# Patient Record
Sex: Male | Born: 1970 | Race: White | Hispanic: No
Health system: Southern US, Community
[De-identification: ages and names within clinical notes are randomized; demographics above are authoritative.]

---

## 2000-01-23 ENCOUNTER — Encounter: Payer: Self-pay | Admitting: Internal Medicine

## 2000-01-23 ENCOUNTER — Emergency Department (HOSPITAL_COMMUNITY): Admission: EM | Admit: 2000-01-23 | Discharge: 2000-01-23 | Payer: Self-pay | Admitting: Internal Medicine

## 2004-08-16 ENCOUNTER — Emergency Department (HOSPITAL_COMMUNITY): Admission: EM | Admit: 2004-08-16 | Discharge: 2004-08-16 | Payer: Self-pay | Admitting: Emergency Medicine

## 2005-09-23 ENCOUNTER — Ambulatory Visit (HOSPITAL_BASED_OUTPATIENT_CLINIC_OR_DEPARTMENT_OTHER): Admission: RE | Admit: 2005-09-23 | Discharge: 2005-09-23 | Payer: Self-pay | Admitting: Urology

## 2005-09-23 ENCOUNTER — Encounter (INDEPENDENT_AMBULATORY_CARE_PROVIDER_SITE_OTHER): Payer: Self-pay | Admitting: Specialist

## 2005-09-27 ENCOUNTER — Ambulatory Visit: Payer: Self-pay | Admitting: Oncology

## 2005-10-06 LAB — CBC WITH DIFFERENTIAL/PLATELET
BASO%: 0.6 % (ref 0.0–2.0)
EOS%: 0.6 % (ref 0.0–7.0)
HCT: 45.8 % (ref 38.7–49.9)
HGB: 16.1 g/dL (ref 13.0–17.1)
MCH: 32.4 pg (ref 28.0–33.4)
MCHC: 35.2 g/dL (ref 32.0–35.9)
MONO#: 0.5 10*3/uL (ref 0.1–0.9)
RDW: 13.5 % (ref 11.2–14.6)
WBC: 10.3 10*3/uL — ABNORMAL HIGH (ref 4.0–10.0)
lymph#: 2.1 10*3/uL (ref 0.9–3.3)

## 2005-10-09 LAB — COMPREHENSIVE METABOLIC PANEL
ALT: 24 U/L (ref 0–40)
AST: 20 U/L (ref 0–37)
Albumin: 4.7 g/dL (ref 3.5–5.2)
CO2: 25 mEq/L (ref 19–32)
Calcium: 9.3 mg/dL (ref 8.4–10.5)
Chloride: 105 mEq/L (ref 96–112)
Potassium: 4.1 mEq/L (ref 3.5–5.3)
Sodium: 141 mEq/L (ref 135–145)
Total Protein: 7.3 g/dL (ref 6.0–8.3)

## 2005-10-09 LAB — LACTATE DEHYDROGENASE: LDH: 153 U/L (ref 94–250)

## 2005-10-15 LAB — CBC WITH DIFFERENTIAL/PLATELET
Basophils Absolute: 0 10*3/uL (ref 0.0–0.1)
Eosinophils Absolute: 0.1 10*3/uL (ref 0.0–0.5)
HGB: 15.7 g/dL (ref 13.0–17.1)
MCV: 93.8 fL (ref 81.6–98.0)
MONO#: 0.6 10*3/uL (ref 0.1–0.9)
MONO%: 5.7 % (ref 0.0–13.0)
NEUT#: 8.7 10*3/uL — ABNORMAL HIGH (ref 1.5–6.5)
RBC: 4.91 10*6/uL (ref 4.20–5.71)
RDW: 13.5 % (ref 11.2–14.6)
WBC: 11.1 10*3/uL — ABNORMAL HIGH (ref 4.0–10.0)
lymph#: 1.6 10*3/uL (ref 0.9–3.3)

## 2005-10-15 LAB — COMPREHENSIVE METABOLIC PANEL
Albumin: 4.6 g/dL (ref 3.5–5.2)
Alkaline Phosphatase: 84 U/L (ref 39–117)
BUN: 20 mg/dL (ref 6–23)
Calcium: 9.7 mg/dL (ref 8.4–10.5)
Chloride: 105 mEq/L (ref 96–112)
Glucose, Bld: 83 mg/dL (ref 70–99)
Potassium: 5.1 mEq/L (ref 3.5–5.3)
Sodium: 143 mEq/L (ref 135–145)
Total Protein: 7.4 g/dL (ref 6.0–8.3)

## 2005-11-04 LAB — CBC WITH DIFFERENTIAL/PLATELET
EOS%: 0.6 % (ref 0.0–7.0)
Eosinophils Absolute: 0 10*3/uL (ref 0.0–0.5)
MCH: 32.4 pg (ref 28.0–33.4)
MCV: 92.3 fL (ref 81.6–98.0)
MONO%: 9.7 % (ref 0.0–13.0)
NEUT#: 1.1 10*3/uL — ABNORMAL LOW (ref 1.5–6.5)
RBC: 4.53 10*6/uL (ref 4.20–5.71)
RDW: 13.6 % (ref 11.2–14.6)
lymph#: 1.3 10*3/uL (ref 0.9–3.3)

## 2005-11-04 LAB — COMPREHENSIVE METABOLIC PANEL
ALT: 26 U/L (ref 0–40)
AST: 19 U/L (ref 0–37)
Albumin: 4.3 g/dL (ref 3.5–5.2)
BUN: 16 mg/dL (ref 6–23)
CO2: 26 mEq/L (ref 19–32)
Calcium: 9.4 mg/dL (ref 8.4–10.5)
Chloride: 106 mEq/L (ref 96–112)
Creatinine, Ser: 1.11 mg/dL (ref 0.40–1.50)
Potassium: 4.6 mEq/L (ref 3.5–5.3)

## 2005-11-09 ENCOUNTER — Ambulatory Visit: Payer: Self-pay | Admitting: Oncology

## 2005-11-12 LAB — COMPREHENSIVE METABOLIC PANEL
ALT: 18 U/L (ref 0–40)
AST: 10 U/L (ref 0–37)
CO2: 25 mEq/L (ref 19–32)
Chloride: 101 mEq/L (ref 96–112)
Creatinine, Ser: 1.03 mg/dL (ref 0.40–1.50)
Sodium: 138 mEq/L (ref 135–145)
Total Protein: 6.5 g/dL (ref 6.0–8.3)

## 2005-11-12 LAB — CBC WITH DIFFERENTIAL/PLATELET
Basophils Absolute: 0 10*3/uL (ref 0.0–0.1)
Eosinophils Absolute: 0 10*3/uL (ref 0.0–0.5)
HGB: 14.8 g/dL (ref 13.0–17.1)
MCV: 91.4 fL (ref 81.6–98.0)
MONO#: 0.1 10*3/uL (ref 0.1–0.9)
MONO%: 1.1 % (ref 0.0–13.0)
NEUT#: 10.8 10*3/uL — ABNORMAL HIGH (ref 1.5–6.5)
RDW: 12.3 % (ref 11.2–14.6)

## 2005-11-26 LAB — COMPREHENSIVE METABOLIC PANEL
Albumin: 4.3 g/dL (ref 3.5–5.2)
Alkaline Phosphatase: 102 U/L (ref 39–117)
BUN: 22 mg/dL (ref 6–23)
CO2: 27 mEq/L (ref 19–32)
Calcium: 9.1 mg/dL (ref 8.4–10.5)
Chloride: 105 mEq/L (ref 96–112)
Glucose, Bld: 121 mg/dL — ABNORMAL HIGH (ref 70–99)
Potassium: 4 mEq/L (ref 3.5–5.3)

## 2005-11-26 LAB — CBC WITH DIFFERENTIAL/PLATELET
Basophils Absolute: 0 10*3/uL (ref 0.0–0.1)
Eosinophils Absolute: 0 10*3/uL (ref 0.0–0.5)
HGB: 13.8 g/dL (ref 13.0–17.1)
MONO#: 0.4 10*3/uL (ref 0.1–0.9)
NEUT#: 4.8 10*3/uL (ref 1.5–6.5)
RBC: 4.24 10*6/uL (ref 4.20–5.71)
RDW: 14.3 % (ref 11.2–14.6)
WBC: 7.2 10*3/uL (ref 4.0–10.0)
lymph#: 2 10*3/uL (ref 0.9–3.3)

## 2005-12-16 LAB — CBC WITH DIFFERENTIAL/PLATELET
Basophils Absolute: 0 10*3/uL (ref 0.0–0.1)
Eosinophils Absolute: 0 10*3/uL (ref 0.0–0.5)
HGB: 13 g/dL (ref 13.0–17.1)
MCV: 94.7 fL (ref 81.6–98.0)
MONO#: 0.4 10*3/uL (ref 0.1–0.9)
MONO%: 10.1 % (ref 0.0–13.0)
NEUT#: 1.7 10*3/uL (ref 1.5–6.5)
Platelets: 169 10*3/uL (ref 145–400)
RBC: 3.95 10*6/uL — ABNORMAL LOW (ref 4.20–5.71)
RDW: 17.1 % — ABNORMAL HIGH (ref 11.2–14.6)
WBC: 3.8 10*3/uL — ABNORMAL LOW (ref 4.0–10.0)

## 2005-12-16 LAB — COMPREHENSIVE METABOLIC PANEL
Albumin: 4.4 g/dL (ref 3.5–5.2)
BUN: 21 mg/dL (ref 6–23)
CO2: 27 mEq/L (ref 19–32)
Calcium: 9.4 mg/dL (ref 8.4–10.5)
Glucose, Bld: 94 mg/dL (ref 70–99)
Potassium: 4.8 mEq/L (ref 3.5–5.3)
Sodium: 143 mEq/L (ref 135–145)
Total Protein: 6.9 g/dL (ref 6.0–8.3)

## 2005-12-21 ENCOUNTER — Ambulatory Visit: Payer: Self-pay | Admitting: Oncology

## 2006-01-11 LAB — CBC WITH DIFFERENTIAL/PLATELET
Basophils Absolute: 0 10*3/uL (ref 0.0–0.1)
EOS%: 0.6 % (ref 0.0–7.0)
Eosinophils Absolute: 0 10*3/uL (ref 0.0–0.5)
HCT: 36.3 % — ABNORMAL LOW (ref 38.7–49.9)
HGB: 12.6 g/dL — ABNORMAL LOW (ref 13.0–17.1)
MCH: 33.8 pg — ABNORMAL HIGH (ref 28.0–33.4)
MONO#: 0.5 10*3/uL (ref 0.1–0.9)
NEUT#: 4.3 10*3/uL (ref 1.5–6.5)
NEUT%: 66.4 % (ref 40.0–75.0)
RDW: 18.5 % — ABNORMAL HIGH (ref 11.2–14.6)
lymph#: 1.6 10*3/uL (ref 0.9–3.3)

## 2006-01-11 LAB — COMPREHENSIVE METABOLIC PANEL
AST: 18 U/L (ref 0–37)
Albumin: 4 g/dL (ref 3.5–5.2)
BUN: 16 mg/dL (ref 6–23)
CO2: 27 mEq/L (ref 19–32)
Calcium: 9.3 mg/dL (ref 8.4–10.5)
Chloride: 104 mEq/L (ref 96–112)
Creatinine, Ser: 1.03 mg/dL (ref 0.40–1.50)
Glucose, Bld: 113 mg/dL — ABNORMAL HIGH (ref 70–99)
Potassium: 4.5 mEq/L (ref 3.5–5.3)

## 2006-03-25 ENCOUNTER — Ambulatory Visit: Payer: Self-pay | Admitting: Oncology

## 2006-03-30 LAB — CBC WITH DIFFERENTIAL/PLATELET
EOS%: 0.6 % (ref 0.0–7.0)
Eosinophils Absolute: 0 10*3/uL (ref 0.0–0.5)
MCH: 33.2 pg (ref 28.0–33.4)
MCV: 96.2 fL (ref 81.6–98.0)
MONO%: 5.1 % (ref 0.0–13.0)
NEUT#: 5 10*3/uL (ref 1.5–6.5)
RBC: 4.73 10*6/uL (ref 4.20–5.71)
RDW: 12.3 % (ref 11.2–14.6)
lymph#: 1.9 10*3/uL (ref 0.9–3.3)

## 2006-04-01 LAB — COMPREHENSIVE METABOLIC PANEL
AST: 34 U/L (ref 0–37)
Albumin: 4.4 g/dL (ref 3.5–5.2)
Alkaline Phosphatase: 76 U/L (ref 39–117)
Chloride: 104 mEq/L (ref 96–112)
Potassium: 4.1 mEq/L (ref 3.5–5.3)
Sodium: 138 mEq/L (ref 135–145)
Total Protein: 6.7 g/dL (ref 6.0–8.3)

## 2006-06-30 ENCOUNTER — Ambulatory Visit (HOSPITAL_COMMUNITY): Admission: RE | Admit: 2006-06-30 | Discharge: 2006-06-30 | Payer: Self-pay | Admitting: Oncology

## 2006-07-05 ENCOUNTER — Ambulatory Visit: Payer: Self-pay | Admitting: Oncology

## 2006-07-07 LAB — CBC WITH DIFFERENTIAL/PLATELET
BASO%: 1.2 % (ref 0.0–2.0)
EOS%: 2.2 % (ref 0.0–7.0)
MCH: 32.9 pg (ref 28.0–33.4)
MCV: 91.9 fL (ref 81.6–98.0)
MONO%: 7.4 % (ref 0.0–13.0)
RBC: 4.59 10*6/uL (ref 4.20–5.71)
RDW: 13.4 % (ref 11.2–14.6)
lymph#: 1.9 10*3/uL (ref 0.9–3.3)

## 2006-07-10 LAB — COMPREHENSIVE METABOLIC PANEL
ALT: 21 U/L (ref 0–53)
AST: 18 U/L (ref 0–37)
Albumin: 4.5 g/dL (ref 3.5–5.2)
Alkaline Phosphatase: 82 U/L (ref 39–117)
Potassium: 4.2 mEq/L (ref 3.5–5.3)
Sodium: 139 mEq/L (ref 135–145)
Total Protein: 7 g/dL (ref 6.0–8.3)

## 2006-07-10 LAB — BETA HCG QUANT (REF LAB): Beta hCG, Tumor Marker: <0.5 m[IU]/mL (ref <5.0)

## 2006-09-30 ENCOUNTER — Ambulatory Visit (HOSPITAL_COMMUNITY): Admission: RE | Admit: 2006-09-30 | Discharge: 2006-09-30 | Payer: Self-pay | Admitting: Oncology

## 2006-10-12 ENCOUNTER — Ambulatory Visit: Payer: Self-pay | Admitting: Oncology

## 2006-10-18 LAB — CBC WITH DIFFERENTIAL/PLATELET
Basophils Absolute: 0 10*3/uL (ref 0.0–0.1)
Eosinophils Absolute: 0.1 10*3/uL (ref 0.0–0.5)
HGB: 16.1 g/dL (ref 13.0–17.1)
MONO#: 0.5 10*3/uL (ref 0.1–0.9)
MONO%: 5.6 % (ref 0.0–13.0)
NEUT#: 6.5 10*3/uL (ref 1.5–6.5)
RBC: 4.89 10*6/uL (ref 4.20–5.71)
RDW: 13 % (ref 11.2–14.6)
WBC: 8.9 10*3/uL (ref 4.0–10.0)
lymph#: 1.8 10*3/uL (ref 0.9–3.3)

## 2006-10-20 LAB — COMPREHENSIVE METABOLIC PANEL
Albumin: 4.6 g/dL (ref 3.5–5.2)
Alkaline Phosphatase: 77 U/L (ref 39–117)
BUN: 19 mg/dL (ref 6–23)
CO2: 24 mEq/L (ref 19–32)
Calcium: 9.6 mg/dL (ref 8.4–10.5)
Chloride: 105 mEq/L (ref 96–112)
Glucose, Bld: 88 mg/dL (ref 70–99)
Potassium: 4.4 mEq/L (ref 3.5–5.3)
Sodium: 139 mEq/L (ref 135–145)
Total Protein: 7.1 g/dL (ref 6.0–8.3)

## 2006-10-20 LAB — AFP TUMOR MARKER: AFP-Tumor Marker: 3.1 ng/mL (ref 0.0–8.0)

## 2007-01-23 ENCOUNTER — Ambulatory Visit: Payer: Self-pay | Admitting: Oncology

## 2007-01-25 LAB — COMPREHENSIVE METABOLIC PANEL
AST: 19 U/L (ref 0–37)
Albumin: 4.5 g/dL (ref 3.5–5.2)
BUN: 19 mg/dL (ref 6–23)
CO2: 23 mEq/L (ref 19–32)
Calcium: 9.4 mg/dL (ref 8.4–10.5)
Chloride: 104 mEq/L (ref 96–112)
Potassium: 4.3 mEq/L (ref 3.5–5.3)

## 2007-01-25 LAB — CBC WITH DIFFERENTIAL/PLATELET
BASO%: 0.5 % (ref 0.0–2.0)
Basophils Absolute: 0 10*3/uL (ref 0.0–0.1)
EOS%: 0.9 % (ref 0.0–7.0)
HGB: 15.9 g/dL (ref 13.0–17.1)
MCH: 32.6 pg (ref 28.0–33.4)
MCHC: 35.7 g/dL (ref 32.0–35.9)
MCV: 91.5 fL (ref 81.6–98.0)
MONO%: 4.4 % (ref 0.0–13.0)
NEUT%: 69.3 % (ref 40.0–75.0)
RDW: 13.2 % (ref 11.2–14.6)

## 2007-01-25 LAB — LACTATE DEHYDROGENASE: LDH: 152 U/L (ref 94–250)

## 2007-04-25 ENCOUNTER — Ambulatory Visit: Payer: Self-pay | Admitting: Oncology

## 2007-04-27 ENCOUNTER — Ambulatory Visit (HOSPITAL_COMMUNITY): Admission: RE | Admit: 2007-04-27 | Discharge: 2007-04-27 | Payer: Self-pay | Admitting: Oncology

## 2007-04-27 LAB — CBC WITH DIFFERENTIAL/PLATELET
BASO%: 0.1 % (ref 0.0–2.0)
LYMPH%: 26.2 % (ref 14.0–48.0)
MCHC: 34.9 g/dL (ref 32.0–35.9)
MCV: 92.3 fL (ref 81.6–98.0)
MONO%: 6.3 % (ref 0.0–13.0)
Platelets: 235 10*3/uL (ref 145–400)
RBC: 5.06 10*6/uL (ref 4.20–5.71)
WBC: 7.9 10*3/uL (ref 4.0–10.0)

## 2007-04-28 LAB — COMPREHENSIVE METABOLIC PANEL
ALT: 90 U/L — ABNORMAL HIGH (ref 0–53)
Albumin: 4.2 g/dL (ref 3.5–5.2)
CO2: 27 mEq/L (ref 19–32)
Calcium: 9.3 mg/dL (ref 8.4–10.5)
Chloride: 100 mEq/L (ref 96–112)
Glucose, Bld: 82 mg/dL (ref 70–99)
Sodium: 136 mEq/L (ref 135–145)
Total Bilirubin: 0.7 mg/dL (ref 0.3–1.2)
Total Protein: 6.5 g/dL (ref 6.0–8.3)

## 2007-04-28 LAB — LACTATE DEHYDROGENASE: LDH: 321 U/L — ABNORMAL HIGH (ref 94–250)

## 2007-04-29 LAB — AFP TUMOR MARKER: AFP-Tumor Marker: 2.6 ng/mL (ref 0.0–8.0)

## 2007-08-07 ENCOUNTER — Ambulatory Visit: Payer: Self-pay | Admitting: Oncology

## 2007-08-10 LAB — CBC WITH DIFFERENTIAL/PLATELET
Basophils Absolute: 0 10*3/uL (ref 0.0–0.1)
Eosinophils Absolute: 0.1 10*3/uL (ref 0.0–0.5)
HGB: 15.1 g/dL (ref 13.0–17.1)
LYMPH%: 24.1 % (ref 14.0–48.0)
MCV: 91.5 fL (ref 81.6–98.0)
MONO%: 3.1 % (ref 0.0–13.0)
NEUT#: 5.7 10*3/uL (ref 1.5–6.5)
NEUT%: 70.9 % (ref 40.0–75.0)
Platelets: 209 10*3/uL (ref 145–400)

## 2007-08-12 LAB — COMPREHENSIVE METABOLIC PANEL
Alkaline Phosphatase: 82 U/L (ref 39–117)
BUN: 23 mg/dL (ref 6–23)
Glucose, Bld: 119 mg/dL — ABNORMAL HIGH (ref 70–99)
Total Bilirubin: 0.3 mg/dL (ref 0.3–1.2)

## 2007-08-12 LAB — BETA HCG QUANT (REF LAB): Beta hCG, Tumor Marker: <0.5 m[IU]/mL (ref <5.0)

## 2007-08-12 LAB — AFP TUMOR MARKER: AFP-Tumor Marker: 2.2 ng/mL (ref 0.0–8.0)

## 2007-11-01 ENCOUNTER — Ambulatory Visit: Payer: Self-pay | Admitting: Oncology

## 2007-11-06 ENCOUNTER — Ambulatory Visit (HOSPITAL_COMMUNITY): Admission: RE | Admit: 2007-11-06 | Discharge: 2007-11-06 | Payer: Self-pay | Admitting: Oncology

## 2007-11-07 LAB — COMPREHENSIVE METABOLIC PANEL
AST: 25 U/L (ref 0–37)
BUN: 18 mg/dL (ref 6–23)
CO2: 27 mEq/L (ref 19–32)
Calcium: 9.2 mg/dL (ref 8.4–10.5)
Chloride: 105 mEq/L (ref 96–112)
Creatinine, Ser: 1.02 mg/dL (ref 0.40–1.50)

## 2007-11-07 LAB — CBC WITH DIFFERENTIAL/PLATELET
Basophils Absolute: 0 10*3/uL (ref 0.0–0.1)
Eosinophils Absolute: 0.1 10*3/uL (ref 0.0–0.5)
HGB: 15.5 g/dL (ref 13.0–17.1)
MCV: 92.9 fL (ref 81.6–98.0)
MONO%: 6.5 % (ref 0.0–13.0)
NEUT#: 4.5 10*3/uL (ref 1.5–6.5)
RDW: 13.1 % (ref 11.2–14.6)

## 2007-11-07 LAB — LACTATE DEHYDROGENASE: LDH: 147 U/L (ref 94–250)

## 2007-11-10 LAB — BETA HCG QUANT (REF LAB): Beta hCG, Tumor Marker: <0.5 m[IU]/mL (ref <5.0)

## 2008-03-06 ENCOUNTER — Ambulatory Visit: Payer: Self-pay | Admitting: Oncology

## 2008-03-13 LAB — CBC WITH DIFFERENTIAL/PLATELET
Basophils Absolute: 0 10*3/uL (ref 0.0–0.1)
Eosinophils Absolute: 0.1 10*3/uL (ref 0.0–0.5)
HCT: 44.8 % (ref 38.7–49.9)
HGB: 15.8 g/dL (ref 13.0–17.1)
LYMPH%: 27.8 % (ref 14.0–48.0)
MCV: 92.1 fL (ref 81.6–98.0)
MONO#: 0.5 10*3/uL (ref 0.1–0.9)
NEUT#: 5.4 10*3/uL (ref 1.5–6.5)
Platelets: 206 10*3/uL (ref 145–400)
RBC: 4.87 10*6/uL (ref 4.20–5.71)
WBC: 8.4 10*3/uL (ref 4.0–10.0)

## 2008-03-15 LAB — COMPREHENSIVE METABOLIC PANEL
Albumin: 4.5 g/dL (ref 3.5–5.2)
BUN: 19 mg/dL (ref 6–23)
CO2: 25 mEq/L (ref 19–32)
Glucose, Bld: 107 mg/dL — ABNORMAL HIGH (ref 70–99)
Sodium: 136 mEq/L (ref 135–145)
Total Bilirubin: 0.3 mg/dL (ref 0.3–1.2)
Total Protein: 6.9 g/dL (ref 6.0–8.3)

## 2008-03-15 LAB — LACTATE DEHYDROGENASE: LDH: 331 U/L — ABNORMAL HIGH (ref 94–250)

## 2008-03-15 LAB — AFP TUMOR MARKER: AFP-Tumor Marker: 3.5 ng/mL (ref 0.0–8.0)

## 2008-07-08 ENCOUNTER — Ambulatory Visit: Payer: Self-pay | Admitting: Oncology

## 2008-07-11 ENCOUNTER — Ambulatory Visit (HOSPITAL_COMMUNITY): Admission: RE | Admit: 2008-07-11 | Discharge: 2008-07-11 | Payer: Self-pay | Admitting: Oncology

## 2008-07-11 LAB — COMPREHENSIVE METABOLIC PANEL
AST: 34 U/L (ref 0–37)
Albumin: 4.4 g/dL (ref 3.5–5.2)
Alkaline Phosphatase: 67 U/L (ref 39–117)
BUN: 23 mg/dL (ref 6–23)
Potassium: 3.8 mEq/L (ref 3.5–5.3)
Total Bilirubin: 0.9 mg/dL (ref 0.3–1.2)

## 2008-07-11 LAB — CBC WITH DIFFERENTIAL/PLATELET
Basophils Absolute: 0 10*3/uL (ref 0.0–0.1)
EOS%: 0.1 % (ref 0.0–7.0)
HGB: 15.9 g/dL (ref 13.0–17.1)
MCH: 32.1 pg (ref 27.2–33.4)
MCV: 92.2 fL (ref 79.3–98.0)
MONO%: 3.3 % (ref 0.0–14.0)
RBC: 4.95 10*6/uL (ref 4.20–5.82)
RDW: 13 % (ref 11.0–14.6)

## 2008-11-15 ENCOUNTER — Ambulatory Visit: Payer: Self-pay | Admitting: Oncology

## 2008-11-19 ENCOUNTER — Ambulatory Visit (HOSPITAL_COMMUNITY): Admission: RE | Admit: 2008-11-19 | Discharge: 2008-11-19 | Payer: Self-pay | Admitting: Oncology

## 2008-11-22 LAB — CBC WITH DIFFERENTIAL/PLATELET
Basophils Absolute: 0 10*3/uL (ref 0.0–0.1)
Eosinophils Absolute: 0.1 10*3/uL (ref 0.0–0.5)
HCT: 42.6 % (ref 38.4–49.9)
HGB: 14.8 g/dL (ref 13.0–17.1)
MCH: 32.1 pg (ref 27.2–33.4)
MONO#: 0.4 10*3/uL (ref 0.1–0.9)
NEUT#: 4.1 10*3/uL (ref 1.5–6.5)
NEUT%: 64 % (ref 39.0–75.0)
WBC: 6.3 10*3/uL (ref 4.0–10.3)
lymph#: 1.7 10*3/uL (ref 0.9–3.3)

## 2008-11-22 LAB — COMPREHENSIVE METABOLIC PANEL
Albumin: 4 g/dL (ref 3.5–5.2)
BUN: 20 mg/dL (ref 6–23)
CO2: 25 mEq/L (ref 19–32)
Calcium: 8.9 mg/dL (ref 8.4–10.5)
Chloride: 108 mEq/L (ref 96–112)
Creatinine, Ser: 0.97 mg/dL (ref 0.40–1.50)
Glucose, Bld: 94 mg/dL (ref 70–99)
Potassium: 4 mEq/L (ref 3.5–5.3)

## 2008-11-22 LAB — LACTATE DEHYDROGENASE: LDH: 151 U/L (ref 94–250)

## 2008-11-24 LAB — AFP TUMOR MARKER: AFP-Tumor Marker: 3.5 ng/mL (ref 0.0–8.0)

## 2009-03-21 ENCOUNTER — Ambulatory Visit: Payer: Self-pay | Admitting: Oncology

## 2009-03-25 ENCOUNTER — Ambulatory Visit (HOSPITAL_COMMUNITY): Admission: RE | Admit: 2009-03-25 | Discharge: 2009-03-25 | Payer: Self-pay | Admitting: Oncology

## 2009-03-25 LAB — CBC WITH DIFFERENTIAL/PLATELET
Basophils Absolute: 0 10*3/uL (ref 0.0–0.1)
EOS%: 1.8 % (ref 0.0–7.0)
HGB: 15.9 g/dL (ref 13.0–17.1)
MCH: 32.4 pg (ref 27.2–33.4)
MCV: 94 fL (ref 79.3–98.0)
MONO%: 7.4 % (ref 0.0–14.0)
RBC: 4.89 10*6/uL (ref 4.20–5.82)
RDW: 13.1 % (ref 11.0–14.6)

## 2009-03-25 LAB — COMPREHENSIVE METABOLIC PANEL
AST: 23 U/L (ref 0–37)
Albumin: 4.1 g/dL (ref 3.5–5.2)
Alkaline Phosphatase: 80 U/L (ref 39–117)
BUN: 18 mg/dL (ref 6–23)
Potassium: 4.1 mEq/L (ref 3.5–5.3)
Total Bilirubin: 0.6 mg/dL (ref 0.3–1.2)

## 2009-03-27 LAB — BETA HCG QUANT (REF LAB): Beta hCG, Tumor Marker: <0.5 m[IU]/mL (ref <5.0)

## 2009-08-11 ENCOUNTER — Ambulatory Visit: Payer: Self-pay | Admitting: Oncology

## 2009-08-12 ENCOUNTER — Ambulatory Visit (HOSPITAL_COMMUNITY): Admission: RE | Admit: 2009-08-12 | Discharge: 2009-08-12 | Payer: Self-pay | Admitting: Oncology

## 2009-08-12 LAB — CBC WITH DIFFERENTIAL/PLATELET
BASO%: 0.4 % (ref 0.0–2.0)
HCT: 44.4 % (ref 38.4–49.9)
LYMPH%: 28.6 % (ref 14.0–49.0)
MCH: 31.9 pg (ref 27.2–33.4)
MCHC: 34.5 g/dL (ref 32.0–36.0)
MCV: 92.4 fL (ref 79.3–98.0)
MONO#: 0.5 10*3/uL (ref 0.1–0.9)
MONO%: 6.9 % (ref 0.0–14.0)
NEUT%: 62.9 % (ref 39.0–75.0)
Platelets: 236 10*3/uL (ref 140–400)
WBC: 7.5 10*3/uL (ref 4.0–10.3)

## 2009-08-12 LAB — COMPREHENSIVE METABOLIC PANEL
ALT: 27 U/L (ref 0–53)
Alkaline Phosphatase: 74 U/L (ref 39–117)
CO2: 27 mEq/L (ref 19–32)
Creatinine, Ser: 1.16 mg/dL (ref 0.40–1.50)
Total Bilirubin: 0.6 mg/dL (ref 0.3–1.2)

## 2009-08-12 LAB — LACTATE DEHYDROGENASE: LDH: 165 U/L (ref 94–250)

## 2009-08-14 LAB — AFP TUMOR MARKER: AFP-Tumor Marker: 2.9 ng/mL (ref 0.0–8.0)

## 2010-02-13 ENCOUNTER — Ambulatory Visit: Payer: Self-pay | Admitting: Oncology

## 2010-02-18 LAB — CBC WITH DIFFERENTIAL/PLATELET
Eosinophils Absolute: 0.1 10*3/uL (ref 0.0–0.5)
MONO#: 0.5 10*3/uL (ref 0.1–0.9)
NEUT#: 5.5 10*3/uL (ref 1.5–6.5)
Platelets: 233 10*3/uL (ref 140–400)
RBC: 4.84 10*6/uL (ref 4.20–5.82)
RDW: 12.9 % (ref 11.0–14.6)
WBC: 7.7 10*3/uL (ref 4.0–10.3)

## 2010-02-19 ENCOUNTER — Ambulatory Visit (HOSPITAL_COMMUNITY): Admission: RE | Admit: 2010-02-19 | Discharge: 2010-02-19 | Payer: Self-pay | Admitting: Oncology

## 2010-02-21 LAB — COMPREHENSIVE METABOLIC PANEL
Albumin: 4.4 g/dL (ref 3.5–5.2)
CO2: 24 mEq/L (ref 19–32)
Glucose, Bld: 89 mg/dL (ref 70–99)
Potassium: 4.3 mEq/L (ref 3.5–5.3)
Sodium: 139 mEq/L (ref 135–145)
Total Protein: 7 g/dL (ref 6.0–8.3)

## 2010-02-21 LAB — BETA HCG QUANT (REF LAB): Beta hCG, Tumor Marker: <0.5 m[IU]/mL (ref <5.0)

## 2010-02-21 LAB — LACTATE DEHYDROGENASE: LDH: 160 U/L (ref 94–250)

## 2010-08-28 NOTE — Op Note (Signed)
[name], [name]                ACCOUNT NO.:  [id number]   MEDICAL RECORD NO.:  [id number]          PATIENT TYPE:  AMB   LOCATION:  [facility]                         FACILITY:  [facility]   PHYSICIAN:  Sigmund I. Patsi Sears, M.D.DATE OF BIRTH:  1971-04-08   DATE OF PROCEDURE:  09/23/2005  DATE OF DISCHARGE:                                 OPERATIVE REPORT   PREOPERATIVE DIAGNOSIS:  Right solid testicular mass.   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   OPERATIONS:  1.  Right inguinal radical orchiectomy.  2.  Frozen section biopsy.  3.  Placement of a right testicular prosthesis.   SURGEON:  Sigmund I. Patsi Sears, M.D.   ANESTHESIA:  General LMA.   PREPARATION:  After appropriate preanesthesia, the patient was brought to  operating room and placed in the operating room in the dorsal supine  position, where general LMA anesthesia was introduced.  He remained in the  supine position, where the right inguinal area was shaved, prepped with  Betadine solution, and draped in usual fashion.   REVIEW OF HISTORY:  This 40 year old male has a 58-month history of enlarging  right scrotal mass.  Ultrasound in the office showed the mass to be solid.  The patient has a positive family history of multiple cancers.  He is a  [employer], is very concerned about out-of-work time and income.  He is  now for a inguinal exploration, and frozen section biopsy of the mass,  possible right radical orchiectomy.   PROCEDURE:  An 8 cm right inguinal incision was made and subcutaneous tissue  dissected with the electrosurgical unit.  Minimal bleeding was noted.  A  single large superficial vein was identified.  This was ligated with 3-0  Vicryl suture.   The fascia of the external oblique muscle was identified, and this was  incised sharply with a 15 blade.  Dissection was then accomplished  underneath it, with great care taken to avoid  injury to the ilioinguinal  nerve.  The fascia of the extra oblique was opened, and the pubic tubercle  was identified.  A large right-angle clamp was used to dissect around the  pubic tubercle, and a Penrose drain was placed around the spermatic cord.  The Penrose drain was then doubly passed and clamped.  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  The testicle was  then amputated, by dissecting the vas, doubly clamping and incising the vas,  then doubly clamping the spermatic cord in two different areas, and  amputating testicle.  This was sent to the laboratory for further  evaluation.  The surgeon and first assistant Then re-gloved, and the  remaining cord was ligated with 0 Vicryl suture.  A #1 Prolene suture was  then used to ligate the base of the spermatic cord, and the long end was  left in case the patient needs future right spermatic cord dissection.  The  wound was copiously irrigated with antibiotic  solution, and saline.  The  spermatic cord block was given with 0.25 plain Marcaine, and Marcaine was  also injected into the wound edges.   Attention was then directed to the right hemiscrotum, where the patient  desired to have a prosthesis placed.  It was elected to place a medium  prosthesis (4.5 cm), even though the mass that was removed was much larger  are, stressing out the scrotal skin.  It was felt that the scrotum would  eventually retract, and that the 4.5 cm prosthesis approximated the left  testicle.  The prosthesis was then filled in standard fashion, with 12 cc of  saline, with no air.  The prosthesis was then sutured in place in accordance  with instruction manual, and sutured with a 3-0 Prolene suture.  Careful  inspection of the scrotum revealed no suture through the skin.   The wound was again irrigated, and the closure accomplished as follows.    CLOSURE:  The external oblique fascia was closed with running 2-0 Vicryl  suture.  The Scarpa's fascia layer was closed with interrupted 3-0 Vicryl  suture, and the skin was closed with running 4-0 Vicryl subcuticular  closure, Benzoin, and Steri-Strips.  Mesh pants were placed. The patient was  given IV Toradol, awakened. and taken to the recovery room in good  condition.      Sigmund I. Patsi Sears, M.D.  Electronically Signed     SIT/MEDQ  D:  09/23/2005  T:  09/23/2005  Job:  [id number]

## 2012-04-16 IMAGING — CR DG CHEST 2V
2 series · 2 of 2 positions shown · non-contrast
Comparison: 08/12/2009

CLINICAL DATA: Follow-up for history of cancer

CHEST - 2 VIEW

[w chest pa]
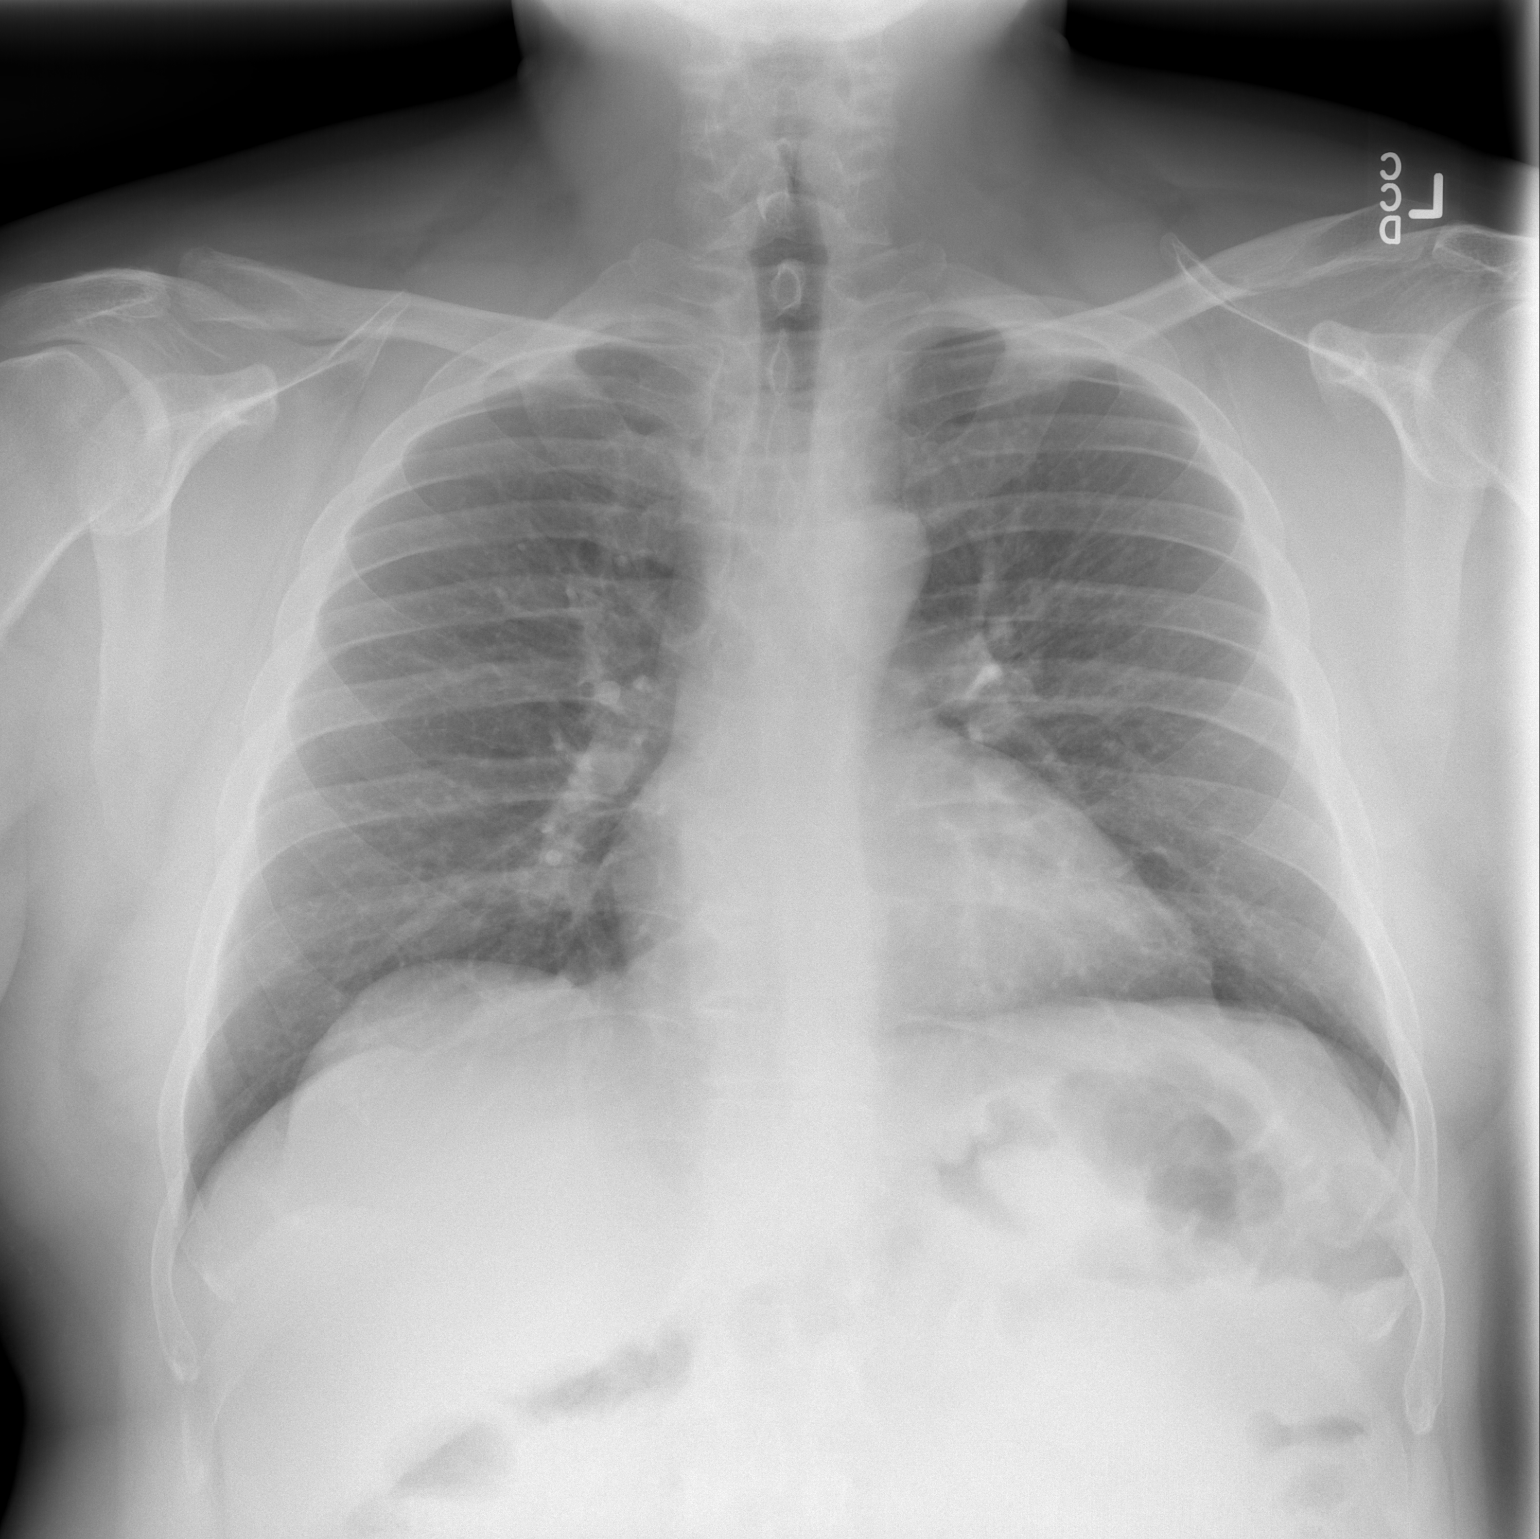

[w chest lat]
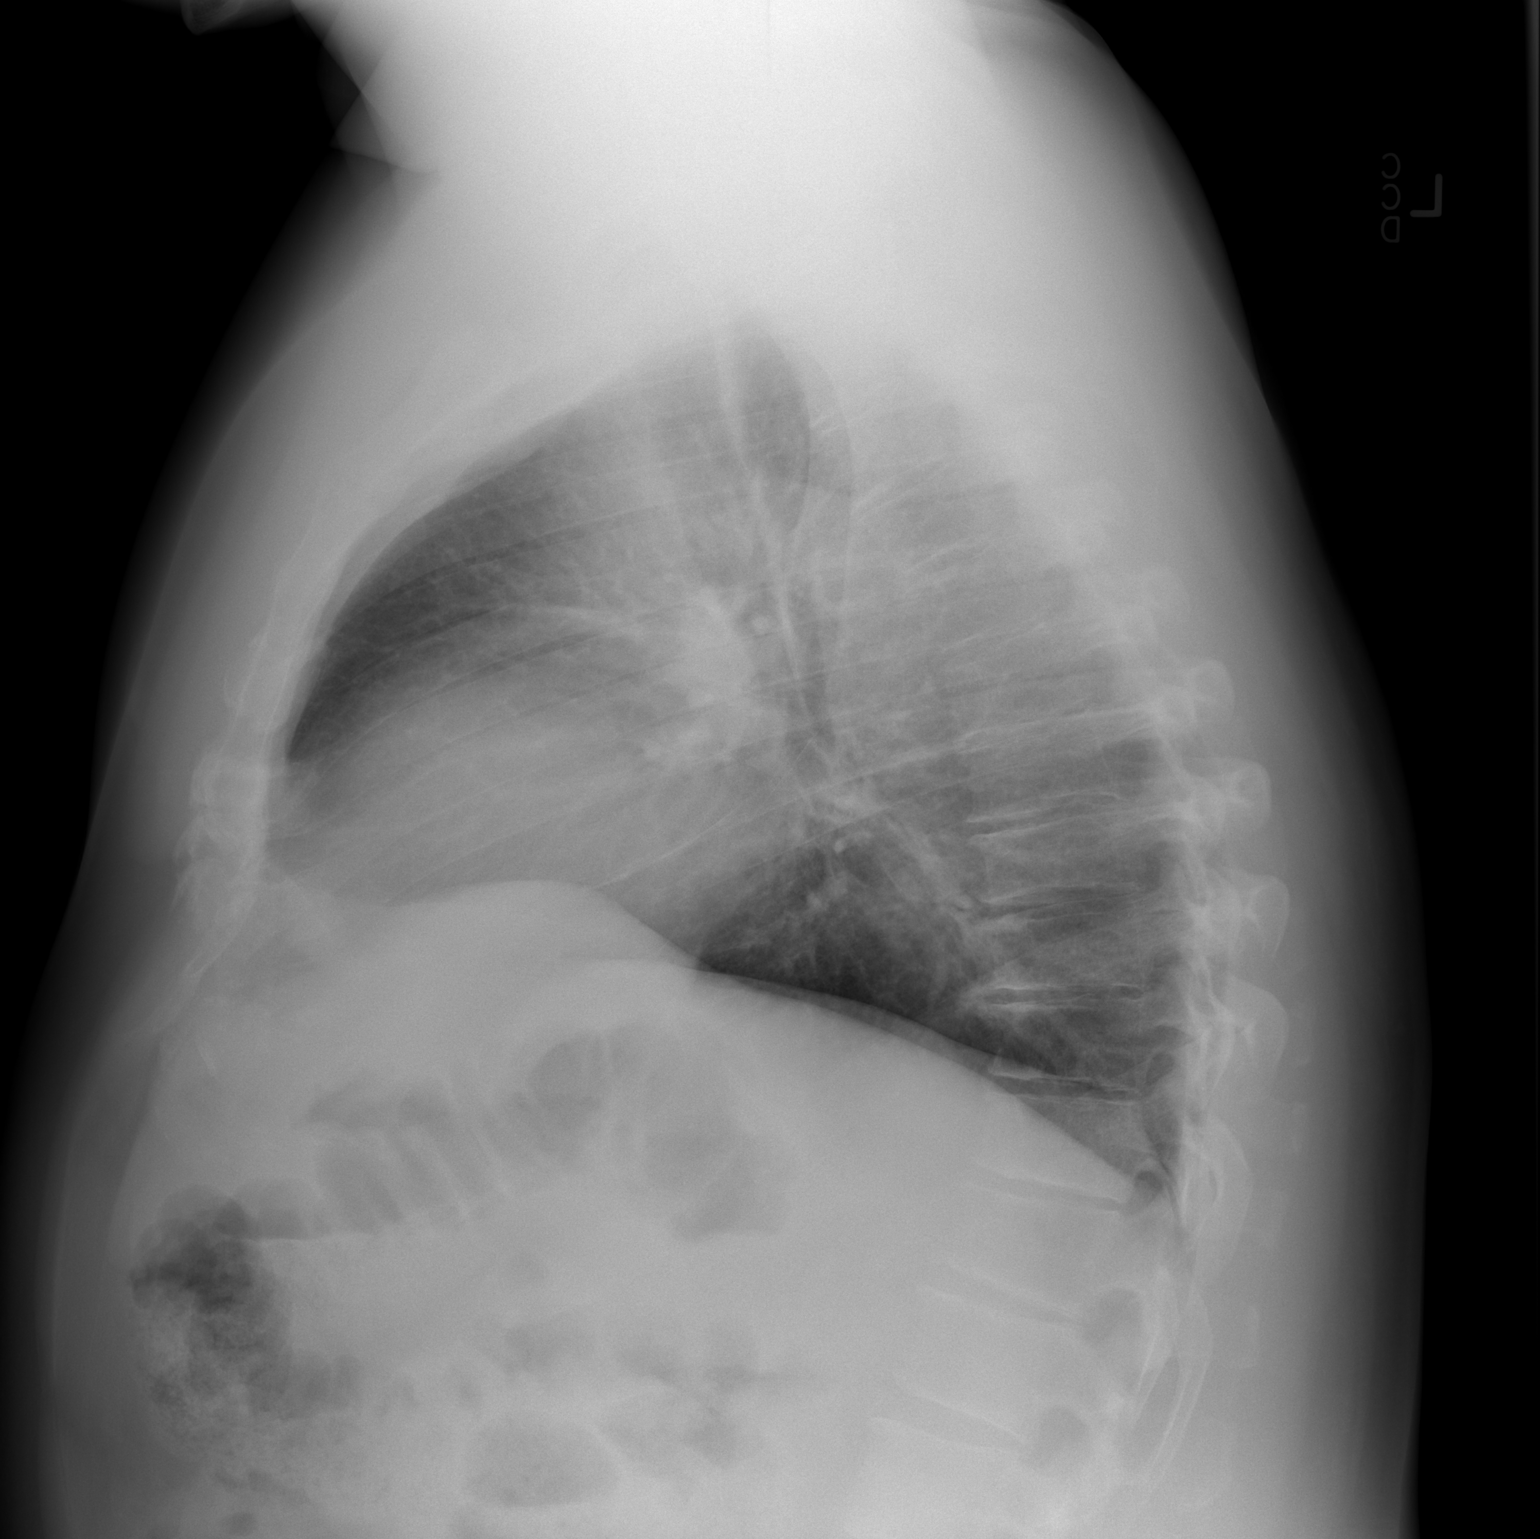

[2 of 2 positions shown; findings below may reference images not displayed]

FINDINGS: Low lung volumes are present.  Taking this into
consideration heart and mediastinal contours are within normal
limits and stable.  The lung fields appear clear with no signs of
focal infiltrate or congestive failure.  No pleural fluid or
peribronchial cuffing is seen.  No focal nodularity is identified
to suggest metastatic disease radiographically.

Bony structures demonstrate mild degenerative changes of the lower
thoracic spine and are otherwise intact.
IMPRESSION: Stable cardiopulmonary appearance with no new focal or acute
cardiopulmonary abnormality noted.

## 2013-11-23 ENCOUNTER — Other Ambulatory Visit: Payer: Self-pay | Admitting: Family Medicine

## 2013-11-23 ENCOUNTER — Ambulatory Visit
Admission: RE | Admit: 2013-11-23 | Discharge: 2013-11-23 | Disposition: A | Discharge status: Home / Self Care | Payer: BC Managed Care – PPO | Source: Ambulatory Visit | Attending: Family Medicine | Admitting: Family Medicine

## 2013-11-23 DIAGNOSIS — M25551 Pain in right hip: Secondary | ICD-10-CM

## 2013-11-23 DIAGNOSIS — M5416 Radiculopathy, lumbar region: Secondary | ICD-10-CM

## 2016-01-19 IMAGING — CR DG HIP COMPLETE 2+V*R*
2 series · 2 of 2 positions shown · non-contrast
Comparison: None.

CLINICAL DATA: Pain

EXAM:
RIGHT HIP - COMPLETE 2+ VIEW

[t hip ap right]
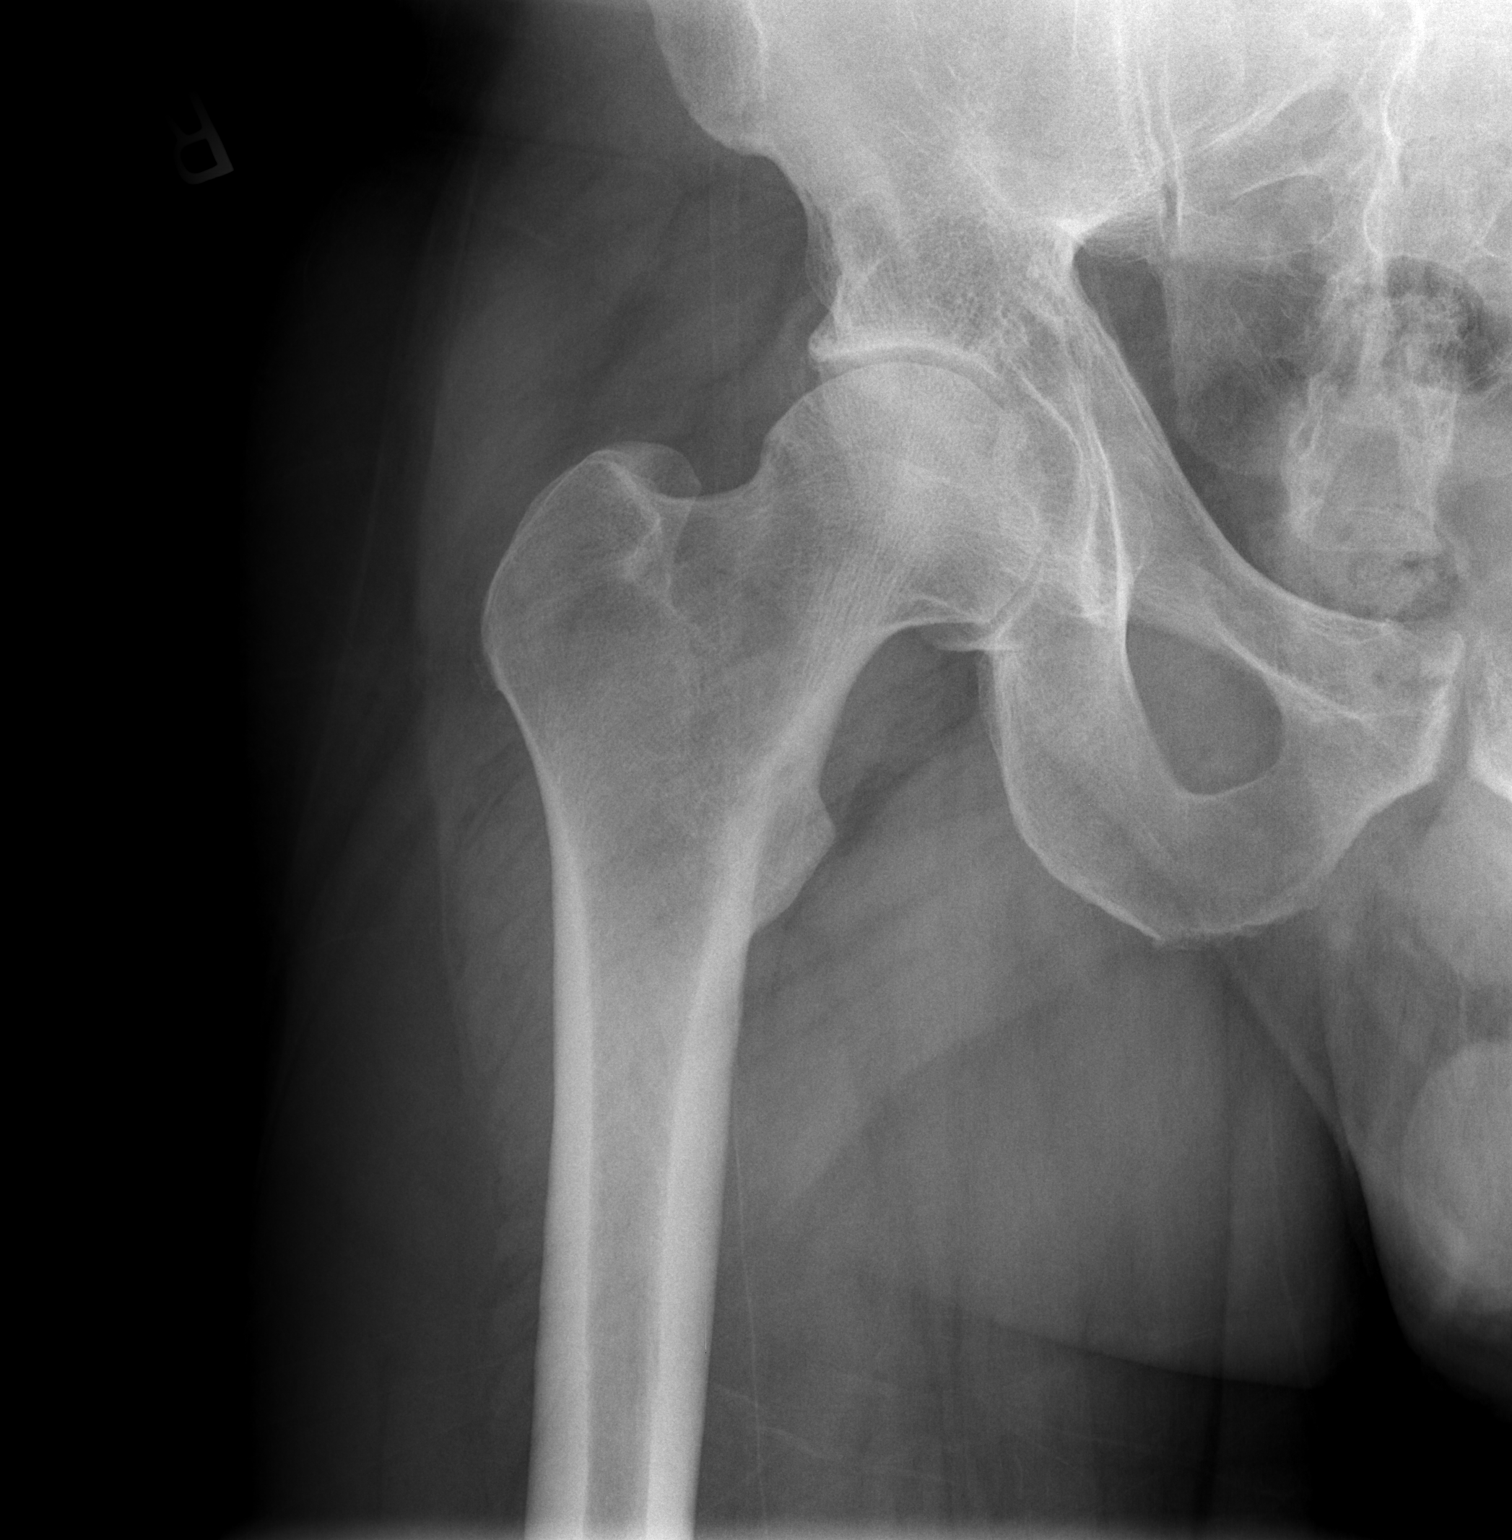

[t hip frog leg right]
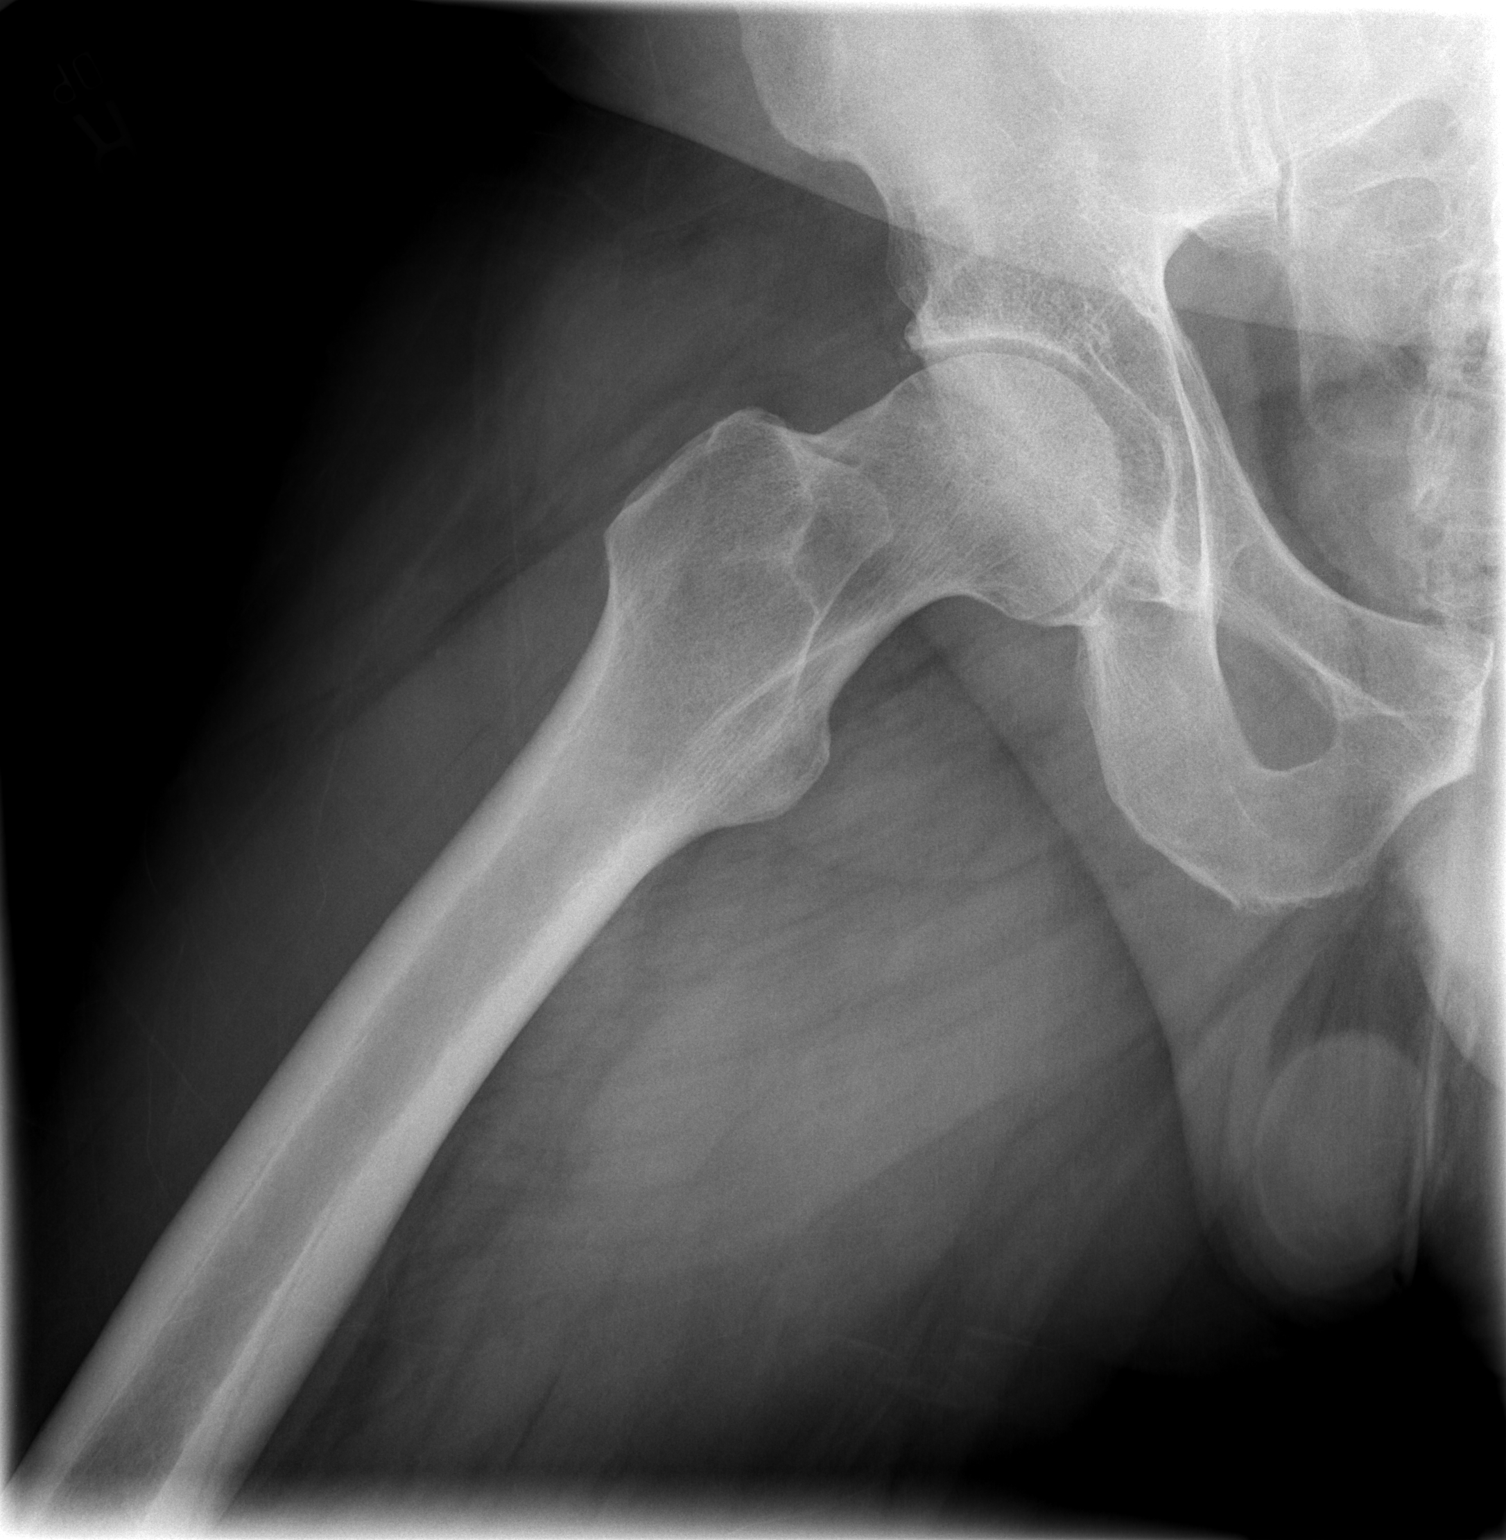

[2 of 2 positions shown; findings below may reference images not displayed]

FINDINGS: Frontal and lateral views were obtained. There is mild narrowing of
the right hip joint. No fracture or dislocation. No erosive change.
IMPRESSION: Mild osteoarthritic change.  No fracture or dislocation.

## 2016-11-17 DIAGNOSIS — G4733 Obstructive sleep apnea (adult) (pediatric): Secondary | ICD-10-CM | POA: Diagnosis not present

## 2016-11-25 DIAGNOSIS — I10 Essential (primary) hypertension: Secondary | ICD-10-CM | POA: Diagnosis not present

## 2016-11-25 DIAGNOSIS — E785 Hyperlipidemia, unspecified: Secondary | ICD-10-CM | POA: Diagnosis not present

## 2016-11-25 DIAGNOSIS — R7303 Prediabetes: Secondary | ICD-10-CM | POA: Diagnosis not present

## 2017-01-04 DIAGNOSIS — M47816 Spondylosis without myelopathy or radiculopathy, lumbar region: Secondary | ICD-10-CM | POA: Diagnosis not present

## 2017-01-05 DIAGNOSIS — M47816 Spondylosis without myelopathy or radiculopathy, lumbar region: Secondary | ICD-10-CM | POA: Diagnosis not present

## 2017-02-14 DIAGNOSIS — M47816 Spondylosis without myelopathy or radiculopathy, lumbar region: Secondary | ICD-10-CM | POA: Diagnosis not present

## 2017-02-15 DIAGNOSIS — Z23 Encounter for immunization: Secondary | ICD-10-CM | POA: Diagnosis not present

## 2017-02-22 DIAGNOSIS — M545 Low back pain: Secondary | ICD-10-CM | POA: Diagnosis not present

## 2017-02-22 DIAGNOSIS — M5416 Radiculopathy, lumbar region: Secondary | ICD-10-CM | POA: Diagnosis not present

## 2017-02-24 DIAGNOSIS — M5416 Radiculopathy, lumbar region: Secondary | ICD-10-CM | POA: Diagnosis not present

## 2017-02-24 DIAGNOSIS — M545 Low back pain: Secondary | ICD-10-CM | POA: Diagnosis not present

## 2017-03-01 DIAGNOSIS — M5416 Radiculopathy, lumbar region: Secondary | ICD-10-CM | POA: Diagnosis not present

## 2017-03-01 DIAGNOSIS — M545 Low back pain: Secondary | ICD-10-CM | POA: Diagnosis not present

## 2017-03-08 DIAGNOSIS — M545 Low back pain: Secondary | ICD-10-CM | POA: Diagnosis not present

## 2017-03-08 DIAGNOSIS — M5416 Radiculopathy, lumbar region: Secondary | ICD-10-CM | POA: Diagnosis not present

## 2017-03-11 DIAGNOSIS — M545 Low back pain: Secondary | ICD-10-CM | POA: Diagnosis not present

## 2017-03-11 DIAGNOSIS — M5416 Radiculopathy, lumbar region: Secondary | ICD-10-CM | POA: Diagnosis not present

## 2017-03-14 DIAGNOSIS — M5416 Radiculopathy, lumbar region: Secondary | ICD-10-CM | POA: Diagnosis not present

## 2017-03-14 DIAGNOSIS — M545 Low back pain: Secondary | ICD-10-CM | POA: Diagnosis not present

## 2017-03-22 DIAGNOSIS — M5416 Radiculopathy, lumbar region: Secondary | ICD-10-CM | POA: Diagnosis not present

## 2017-03-22 DIAGNOSIS — M545 Low back pain: Secondary | ICD-10-CM | POA: Diagnosis not present

## 2017-03-24 DIAGNOSIS — M5416 Radiculopathy, lumbar region: Secondary | ICD-10-CM | POA: Diagnosis not present

## 2017-03-24 DIAGNOSIS — M545 Low back pain: Secondary | ICD-10-CM | POA: Diagnosis not present

## 2017-05-26 DIAGNOSIS — D485 Neoplasm of uncertain behavior of skin: Secondary | ICD-10-CM | POA: Diagnosis not present

## 2017-05-26 DIAGNOSIS — D225 Melanocytic nevi of trunk: Secondary | ICD-10-CM | POA: Diagnosis not present

## 2017-05-26 DIAGNOSIS — L821 Other seborrheic keratosis: Secondary | ICD-10-CM | POA: Diagnosis not present

## 2017-05-26 DIAGNOSIS — B078 Other viral warts: Secondary | ICD-10-CM | POA: Diagnosis not present

## 2017-05-31 DIAGNOSIS — G4733 Obstructive sleep apnea (adult) (pediatric): Secondary | ICD-10-CM | POA: Diagnosis not present

## 2017-08-15 DIAGNOSIS — H3562 Retinal hemorrhage, left eye: Secondary | ICD-10-CM | POA: Diagnosis not present

## 2017-10-03 DIAGNOSIS — I10 Essential (primary) hypertension: Secondary | ICD-10-CM | POA: Diagnosis not present

## 2017-10-03 DIAGNOSIS — R7303 Prediabetes: Secondary | ICD-10-CM | POA: Diagnosis not present

## 2017-10-03 DIAGNOSIS — E785 Hyperlipidemia, unspecified: Secondary | ICD-10-CM | POA: Diagnosis not present

## 2018-03-31 DIAGNOSIS — R7303 Prediabetes: Secondary | ICD-10-CM | POA: Diagnosis not present

## 2018-03-31 DIAGNOSIS — E785 Hyperlipidemia, unspecified: Secondary | ICD-10-CM | POA: Diagnosis not present

## 2018-03-31 DIAGNOSIS — I10 Essential (primary) hypertension: Secondary | ICD-10-CM | POA: Diagnosis not present

## 2018-06-12 DIAGNOSIS — D1801 Hemangioma of skin and subcutaneous tissue: Secondary | ICD-10-CM | POA: Diagnosis not present

## 2018-06-12 DIAGNOSIS — D225 Melanocytic nevi of trunk: Secondary | ICD-10-CM | POA: Diagnosis not present

## 2018-06-12 DIAGNOSIS — D2230 Melanocytic nevi of unspecified part of face: Secondary | ICD-10-CM | POA: Diagnosis not present

## 2018-06-29 DIAGNOSIS — M6208 Separation of muscle (nontraumatic), other site: Secondary | ICD-10-CM | POA: Diagnosis not present

## 2018-10-06 DIAGNOSIS — I10 Essential (primary) hypertension: Secondary | ICD-10-CM | POA: Diagnosis not present

## 2018-10-06 DIAGNOSIS — R7303 Prediabetes: Secondary | ICD-10-CM | POA: Diagnosis not present

## 2018-10-06 DIAGNOSIS — E785 Hyperlipidemia, unspecified: Secondary | ICD-10-CM | POA: Diagnosis not present

## 2018-11-03 DIAGNOSIS — R7303 Prediabetes: Secondary | ICD-10-CM | POA: Diagnosis not present

## 2018-11-03 DIAGNOSIS — I10 Essential (primary) hypertension: Secondary | ICD-10-CM | POA: Diagnosis not present

## 2018-11-03 DIAGNOSIS — E785 Hyperlipidemia, unspecified: Secondary | ICD-10-CM | POA: Diagnosis not present

## 2019-03-06 DIAGNOSIS — Z189 Retained foreign body fragments, unspecified material: Secondary | ICD-10-CM | POA: Diagnosis not present

## 2019-03-06 DIAGNOSIS — H02814 Retained foreign body in left upper eyelid: Secondary | ICD-10-CM | POA: Diagnosis not present

## 2019-04-12 DIAGNOSIS — G4733 Obstructive sleep apnea (adult) (pediatric): Secondary | ICD-10-CM | POA: Diagnosis not present

## 2019-06-28 DIAGNOSIS — D1801 Hemangioma of skin and subcutaneous tissue: Secondary | ICD-10-CM | POA: Diagnosis not present

## 2019-06-28 DIAGNOSIS — L814 Other melanin hyperpigmentation: Secondary | ICD-10-CM | POA: Diagnosis not present

## 2019-06-28 DIAGNOSIS — L820 Inflamed seborrheic keratosis: Secondary | ICD-10-CM | POA: Diagnosis not present

## 2019-06-28 DIAGNOSIS — D2230 Melanocytic nevi of unspecified part of face: Secondary | ICD-10-CM | POA: Diagnosis not present

## 2019-06-28 DIAGNOSIS — L918 Other hypertrophic disorders of the skin: Secondary | ICD-10-CM | POA: Diagnosis not present

## 2019-08-21 DIAGNOSIS — I10 Essential (primary) hypertension: Secondary | ICD-10-CM | POA: Diagnosis not present

## 2019-08-21 DIAGNOSIS — R7303 Prediabetes: Secondary | ICD-10-CM | POA: Diagnosis not present

## 2019-08-21 DIAGNOSIS — F17200 Nicotine dependence, unspecified, uncomplicated: Secondary | ICD-10-CM | POA: Diagnosis not present

## 2019-08-21 DIAGNOSIS — E785 Hyperlipidemia, unspecified: Secondary | ICD-10-CM | POA: Diagnosis not present

## 2019-12-18 DIAGNOSIS — E785 Hyperlipidemia, unspecified: Secondary | ICD-10-CM | POA: Diagnosis not present

## 2019-12-18 DIAGNOSIS — F17200 Nicotine dependence, unspecified, uncomplicated: Secondary | ICD-10-CM | POA: Diagnosis not present

## 2019-12-18 DIAGNOSIS — I10 Essential (primary) hypertension: Secondary | ICD-10-CM | POA: Diagnosis not present

## 2019-12-18 DIAGNOSIS — R7303 Prediabetes: Secondary | ICD-10-CM | POA: Diagnosis not present

## 2020-02-12 DIAGNOSIS — M25511 Pain in right shoulder: Secondary | ICD-10-CM | POA: Diagnosis not present

## 2020-04-21 DIAGNOSIS — R7303 Prediabetes: Secondary | ICD-10-CM | POA: Diagnosis not present

## 2020-04-21 DIAGNOSIS — I10 Essential (primary) hypertension: Secondary | ICD-10-CM | POA: Diagnosis not present

## 2020-04-21 DIAGNOSIS — F17200 Nicotine dependence, unspecified, uncomplicated: Secondary | ICD-10-CM | POA: Diagnosis not present

## 2020-04-21 DIAGNOSIS — E785 Hyperlipidemia, unspecified: Secondary | ICD-10-CM | POA: Diagnosis not present

## 2020-04-21 DIAGNOSIS — Z125 Encounter for screening for malignant neoplasm of prostate: Secondary | ICD-10-CM | POA: Diagnosis not present

## 2020-05-20 DIAGNOSIS — G4733 Obstructive sleep apnea (adult) (pediatric): Secondary | ICD-10-CM | POA: Diagnosis not present

## 2020-06-26 DIAGNOSIS — L821 Other seborrheic keratosis: Secondary | ICD-10-CM | POA: Diagnosis not present

## 2020-06-26 DIAGNOSIS — D485 Neoplasm of uncertain behavior of skin: Secondary | ICD-10-CM | POA: Diagnosis not present

## 2020-06-26 DIAGNOSIS — D225 Melanocytic nevi of trunk: Secondary | ICD-10-CM | POA: Diagnosis not present

## 2020-06-26 DIAGNOSIS — D1801 Hemangioma of skin and subcutaneous tissue: Secondary | ICD-10-CM | POA: Diagnosis not present

## 2020-06-26 DIAGNOSIS — D2230 Melanocytic nevi of unspecified part of face: Secondary | ICD-10-CM | POA: Diagnosis not present

## 2020-06-26 DIAGNOSIS — L814 Other melanin hyperpigmentation: Secondary | ICD-10-CM | POA: Diagnosis not present

## 2020-06-26 DIAGNOSIS — L820 Inflamed seborrheic keratosis: Secondary | ICD-10-CM | POA: Diagnosis not present

## 2020-12-04 DIAGNOSIS — K529 Noninfective gastroenteritis and colitis, unspecified: Secondary | ICD-10-CM | POA: Diagnosis not present

## 2020-12-04 DIAGNOSIS — K633 Ulcer of intestine: Secondary | ICD-10-CM | POA: Diagnosis not present

## 2020-12-04 DIAGNOSIS — K6389 Other specified diseases of intestine: Secondary | ICD-10-CM | POA: Diagnosis not present

## 2020-12-04 DIAGNOSIS — K648 Other hemorrhoids: Secondary | ICD-10-CM | POA: Diagnosis not present

## 2020-12-04 DIAGNOSIS — K5289 Other specified noninfective gastroenteritis and colitis: Secondary | ICD-10-CM | POA: Diagnosis not present

## 2020-12-04 DIAGNOSIS — Z1211 Encounter for screening for malignant neoplasm of colon: Secondary | ICD-10-CM | POA: Diagnosis not present

## 2020-12-04 DIAGNOSIS — D123 Benign neoplasm of transverse colon: Secondary | ICD-10-CM | POA: Diagnosis not present

## 2020-12-04 DIAGNOSIS — K5730 Diverticulosis of large intestine without perforation or abscess without bleeding: Secondary | ICD-10-CM | POA: Diagnosis not present

## 2020-12-10 DIAGNOSIS — R7303 Prediabetes: Secondary | ICD-10-CM | POA: Diagnosis not present

## 2020-12-10 DIAGNOSIS — E785 Hyperlipidemia, unspecified: Secondary | ICD-10-CM | POA: Diagnosis not present

## 2020-12-10 DIAGNOSIS — I10 Essential (primary) hypertension: Secondary | ICD-10-CM | POA: Diagnosis not present

## 2020-12-16 DIAGNOSIS — R7303 Prediabetes: Secondary | ICD-10-CM | POA: Diagnosis not present

## 2020-12-16 DIAGNOSIS — E785 Hyperlipidemia, unspecified: Secondary | ICD-10-CM | POA: Diagnosis not present

## 2020-12-16 DIAGNOSIS — I10 Essential (primary) hypertension: Secondary | ICD-10-CM | POA: Diagnosis not present

## 2021-01-26 DIAGNOSIS — G4733 Obstructive sleep apnea (adult) (pediatric): Secondary | ICD-10-CM | POA: Diagnosis not present

## 2021-02-26 DIAGNOSIS — G4733 Obstructive sleep apnea (adult) (pediatric): Secondary | ICD-10-CM | POA: Diagnosis not present

## 2021-03-28 DIAGNOSIS — G4733 Obstructive sleep apnea (adult) (pediatric): Secondary | ICD-10-CM | POA: Diagnosis not present

## 2021-04-20 DIAGNOSIS — G4733 Obstructive sleep apnea (adult) (pediatric): Secondary | ICD-10-CM | POA: Diagnosis not present

## 2021-04-21 DIAGNOSIS — J069 Acute upper respiratory infection, unspecified: Secondary | ICD-10-CM | POA: Diagnosis not present

## 2021-04-21 DIAGNOSIS — J029 Acute pharyngitis, unspecified: Secondary | ICD-10-CM | POA: Diagnosis not present

## 2021-04-28 DIAGNOSIS — G4733 Obstructive sleep apnea (adult) (pediatric): Secondary | ICD-10-CM | POA: Diagnosis not present

## 2021-05-29 DIAGNOSIS — G4733 Obstructive sleep apnea (adult) (pediatric): Secondary | ICD-10-CM | POA: Diagnosis not present

## 2021-06-18 DIAGNOSIS — I10 Essential (primary) hypertension: Secondary | ICD-10-CM | POA: Diagnosis not present

## 2021-06-18 DIAGNOSIS — E785 Hyperlipidemia, unspecified: Secondary | ICD-10-CM | POA: Diagnosis not present

## 2021-06-18 DIAGNOSIS — R7303 Prediabetes: Secondary | ICD-10-CM | POA: Diagnosis not present

## 2021-06-18 DIAGNOSIS — Z125 Encounter for screening for malignant neoplasm of prostate: Secondary | ICD-10-CM | POA: Diagnosis not present

## 2021-06-18 DIAGNOSIS — F17200 Nicotine dependence, unspecified, uncomplicated: Secondary | ICD-10-CM | POA: Diagnosis not present

## 2021-06-26 DIAGNOSIS — G4733 Obstructive sleep apnea (adult) (pediatric): Secondary | ICD-10-CM | POA: Diagnosis not present

## 2021-06-29 DIAGNOSIS — L814 Other melanin hyperpigmentation: Secondary | ICD-10-CM | POA: Diagnosis not present

## 2021-06-29 DIAGNOSIS — L578 Other skin changes due to chronic exposure to nonionizing radiation: Secondary | ICD-10-CM | POA: Diagnosis not present

## 2021-06-29 DIAGNOSIS — L821 Other seborrheic keratosis: Secondary | ICD-10-CM | POA: Diagnosis not present

## 2021-06-29 DIAGNOSIS — D1801 Hemangioma of skin and subcutaneous tissue: Secondary | ICD-10-CM | POA: Diagnosis not present

## 2021-06-29 DIAGNOSIS — B078 Other viral warts: Secondary | ICD-10-CM | POA: Diagnosis not present

## 2021-07-09 DIAGNOSIS — M25562 Pain in left knee: Secondary | ICD-10-CM | POA: Diagnosis not present

## 2021-07-27 DIAGNOSIS — G4733 Obstructive sleep apnea (adult) (pediatric): Secondary | ICD-10-CM | POA: Diagnosis not present

## 2021-08-26 DIAGNOSIS — G4733 Obstructive sleep apnea (adult) (pediatric): Secondary | ICD-10-CM | POA: Diagnosis not present

## 2021-09-26 DIAGNOSIS — G4733 Obstructive sleep apnea (adult) (pediatric): Secondary | ICD-10-CM | POA: Diagnosis not present

## 2021-10-26 DIAGNOSIS — G4733 Obstructive sleep apnea (adult) (pediatric): Secondary | ICD-10-CM | POA: Diagnosis not present

## 2021-11-05 DIAGNOSIS — G4733 Obstructive sleep apnea (adult) (pediatric): Secondary | ICD-10-CM | POA: Diagnosis not present

## 2021-12-02 DIAGNOSIS — E785 Hyperlipidemia, unspecified: Secondary | ICD-10-CM | POA: Diagnosis not present

## 2021-12-02 DIAGNOSIS — R7303 Prediabetes: Secondary | ICD-10-CM | POA: Diagnosis not present

## 2021-12-02 DIAGNOSIS — I10 Essential (primary) hypertension: Secondary | ICD-10-CM | POA: Diagnosis not present

## 2021-12-06 DIAGNOSIS — G4733 Obstructive sleep apnea (adult) (pediatric): Secondary | ICD-10-CM | POA: Diagnosis not present

## 2022-01-06 DIAGNOSIS — G4733 Obstructive sleep apnea (adult) (pediatric): Secondary | ICD-10-CM | POA: Diagnosis not present

## 2022-02-01 DIAGNOSIS — M25562 Pain in left knee: Secondary | ICD-10-CM | POA: Diagnosis not present

## 2022-02-04 DIAGNOSIS — G4733 Obstructive sleep apnea (adult) (pediatric): Secondary | ICD-10-CM | POA: Diagnosis not present

## 2022-02-22 DIAGNOSIS — I10 Essential (primary) hypertension: Secondary | ICD-10-CM | POA: Diagnosis not present

## 2022-02-22 DIAGNOSIS — M5431 Sciatica, right side: Secondary | ICD-10-CM | POA: Diagnosis not present
